# Patient Record
Sex: Female | Born: 1985 | Hispanic: Yes | Marital: Married | Smoking: Never smoker
Health system: Southern US, Community
[De-identification: ages and names within clinical notes are randomized; demographics above are authoritative.]

## PROBLEM LIST (undated history)

## (undated) ENCOUNTER — Inpatient Hospital Stay (HOSPITAL_COMMUNITY): Payer: Self-pay

## (undated) HISTORY — PX: BREAST SURGERY: SHX581

## (undated) HISTORY — PX: BREAST ENHANCEMENT SURGERY: SHX7

---

## 2016-10-25 ENCOUNTER — Encounter (HOSPITAL_BASED_OUTPATIENT_CLINIC_OR_DEPARTMENT_OTHER): Payer: Self-pay | Admitting: *Deleted

## 2016-10-25 ENCOUNTER — Emergency Department (HOSPITAL_BASED_OUTPATIENT_CLINIC_OR_DEPARTMENT_OTHER)
Admission: EM | Admit: 2016-10-25 | Discharge: 2016-10-25 | Disposition: A | Discharge status: Home / Self Care | Payer: Medicaid Other | Attending: Emergency Medicine | Admitting: Emergency Medicine

## 2016-10-25 ENCOUNTER — Emergency Department (HOSPITAL_BASED_OUTPATIENT_CLINIC_OR_DEPARTMENT_OTHER): Payer: Medicaid Other

## 2016-10-25 DIAGNOSIS — R1030 Lower abdominal pain, unspecified: Secondary | ICD-10-CM

## 2016-10-25 DIAGNOSIS — Z3490 Encounter for supervision of normal pregnancy, unspecified, unspecified trimester: Secondary | ICD-10-CM

## 2016-10-25 DIAGNOSIS — R102 Pelvic and perineal pain: Secondary | ICD-10-CM | POA: Insufficient documentation

## 2016-10-25 DIAGNOSIS — O26891 Other specified pregnancy related conditions, first trimester: Secondary | ICD-10-CM | POA: Insufficient documentation

## 2016-10-25 DIAGNOSIS — Z3A01 Less than 8 weeks gestation of pregnancy: Secondary | ICD-10-CM | POA: Diagnosis not present

## 2016-10-25 LAB — URINALYSIS, ROUTINE W REFLEX MICROSCOPIC
Bilirubin Urine: NEGATIVE
Glucose, UA: NEGATIVE mg/dL
KETONES UR: NEGATIVE mg/dL
LEUKOCYTES UA: NEGATIVE
NITRITE: NEGATIVE
PH: 6.5 (ref 5.0–8.0)
Protein, ur: NEGATIVE mg/dL
Specific Gravity, Urine: 1.013 (ref 1.005–1.030)

## 2016-10-25 LAB — URINE MICROSCOPIC-ADD ON

## 2016-10-25 LAB — HCG, QUANTITATIVE, PREGNANCY: HCG, BETA CHAIN, QUANT, S: 1898 m[IU]/mL — AB (ref <5)

## 2016-10-25 LAB — PREGNANCY, URINE: Preg Test, Ur: POSITIVE — AB

## 2016-10-25 NOTE — Discharge Instructions (Signed)
You were seen in the ED today with lower abdominal pain. We found evidence of an early pregnancy. We were unable to see a pregnancy in the uterus, probably because it is very early. You will be called by the OB/Gyn tomorrow to set up an appointment for repeat blood work to ensure that the pregnancy is in the correct place and healthy.   Return to the ED immediately with any new or suddenly worsening pain, nausea, or fainting.

## 2016-10-25 NOTE — ED Notes (Signed)
Patient transported to Ultrasound 

## 2016-10-25 NOTE — ED Notes (Signed)
ED Provider at bedside. 

## 2016-10-25 NOTE — ED Provider Notes (Signed)
Emergency Department Provider Note  By signing my name below, I, Teofilo Pod, attest that this documentation has been prepared under the direction and in the presence of Maia Plan, MD . Electronically Signed: Teofilo Pod, ED Scribe. 10/25/2016. 7:34 PM.   I have reviewed the triage vital signs and the nursing notes.   HISTORY  Chief Complaint Abdominal Pain  HPI Alice Sweeney is a 30 y.o. female who presents to the Emergency Department complaining of constant lower abdominal pain that began 2 days ago. Pt had a positive pregnancy test at [facility] today, and was referred to the ED by UC. LNMP was 09/19/16.  Pt reports similar pain previously that was associated with her periods. No alleviating factors noted. Pt denies vaginal pain, vaginal discharge, chest pain. No exacerbating factors.   History reviewed. No pertinent past medical history.  There are no active problems to display for this patient.   Past Surgical History:  Procedure Laterality Date  . BREAST SURGERY        Allergies Patient has no known allergies.  No family history on file.  Social History Social History  Substance Use Topics  . Smoking status: Never Smoker  . Smokeless tobacco: Never Used  . Alcohol use No    Review of Systems  Constitutional: No fever/chills Eyes: No visual changes. ENT: No sore throat. Cardiovascular: Denies chest pain. Respiratory: Denies shortness of breath. Gastrointestinal: Positive for abdominal pain.  No nausea, no vomiting.  No diarrhea.  No constipation. Genitourinary: Negative for dysuria. Negative for vaginal pain/discharge. Musculoskeletal: Negative for back pain. Skin: Negative for rash. Neurological: Negative for headaches, focal weakness or numbness.  10-point ROS otherwise negative.  ____________________________________________   PHYSICAL EXAM:  VITAL SIGNS: ED Triage Vitals  Enc Vitals Group     BP 10/25/16 1858 123/77     Pulse  Rate 10/25/16 1858 63     Resp 10/25/16 1858 18     Temp 10/25/16 1858 98.4 F (36.9 C)     Temp Source 10/25/16 1858 Oral     SpO2 10/25/16 1858 100 %     Weight 10/25/16 1858 135 lb (61.2 kg)     Height 10/25/16 1858 5' (1.524 m)     Pain Score 10/25/16 1901 6   Constitutional: Alert and oriented. Well appearing and in no acute distress. Eyes: Conjunctivae are normal.  Head: Atraumatic. Nose: No congestion/rhinnorhea. Mouth/Throat: Mucous membranes are moist.   Neck: No stridor.   Cardiovascular: Normal rate, regular rhythm. Good peripheral circulation. Grossly normal heart sounds.   Respiratory: Normal respiratory effort.  No retractions. Lungs CTAB. Gastrointestinal: Soft with minimal lower abdominal tenderness. No distention.  Musculoskeletal: No lower extremity tenderness nor edema. No gross deformities of extremities. Neurologic:  Normal speech and language. No gross focal neurologic deficits are appreciated.  Skin:  Skin is warm, dry and intact. No rash noted.  ____________________________________________   LABS (all labs ordered are listed, but only abnormal results are displayed)  Labs Reviewed  URINALYSIS, ROUTINE W REFLEX MICROSCOPIC (NOT AT [facility]) - Abnormal; Notable for the following:       Result Value   Hgb urine dipstick TRACE (*)    All other components within normal limits  PREGNANCY, URINE - Abnormal; Notable for the following:    Preg Test, Ur POSITIVE (*)    All other components within normal limits  HCG, QUANTITATIVE, PREGNANCY - Abnormal; Notable for the following:    hCG, Beta Chain, Quant, S 1,898 (*)  All other components within normal limits  URINE MICROSCOPIC-ADD ON - Abnormal; Notable for the following:    Squamous Epithelial / LPF 0-5 (*)    Bacteria, UA RARE (*)    All other components within normal limits   ____________________________________________  RADIOLOGY  [place] Ob Comp Less 14 Wks  Result Date: 10/25/2016 CLINICAL DATA:  30 y/o  F; lower back pain and frontal pelvic pain for 1 week. Positive beta HCG. EXAM: OBSTETRIC <14 WK ULTRASOUND TECHNIQUE: Transabdominal ultrasound was performed for evaluation of the gestation as well as the maternal uterus and adnexal regions. COMPARISON:  None. FINDINGS: Intrauterine gestational sac: Single Yolk sac:  Not Visualized. Embryo:  Not Visualized. Cardiac Activity: Not Visualized. MSD: 3 mm  mm   5 w   0  d Subchorionic hemorrhage:  None visualized. Maternal uterus/adnexae: Normal uterus and right ovary. Left ovary avascular round well-circumscribed structure measuring 2.2 x 1.9 x 2.3 cm with internal low-level echoes and linear foci, probably representing a hemorrhagic corpus luteum. IMPRESSION: Probable early intrauterine gestational sac, but no yolk sac, fetal pole, or cardiac activity yet visualized. Recommend follow-up quantitative B-HCG levels and follow-up US in 14 days to confirm and assess viability. This recommendation follows SRU consensus guidelines: Diagnostic Criteria for Nonviable Pregnancy Early in the First Trimester. Malva Limes Engl J Med 2013; 161:0960-45; 369:1443-51. Electronically Signed   By: Mitzi HansenLance  Furusawa-Stratton M.D.   On: 10/25/2016 21:49   [place] Ob Transvaginal  Result Date: 10/25/2016 CLINICAL DATA:  30 y/o F; lower back pain and frontal pelvic pain for 1 week. Positive beta HCG. EXAM: OBSTETRIC <14 WK ULTRASOUND TECHNIQUE: Transabdominal ultrasound was performed for evaluation of the gestation as well as the maternal uterus and adnexal regions. COMPARISON:  None. FINDINGS: Intrauterine gestational sac: Single Yolk sac:  Not Visualized. Embryo:  Not Visualized. Cardiac Activity: Not Visualized. MSD: 3 mm  mm   5 w   0  d Subchorionic hemorrhage:  None visualized. Maternal uterus/adnexae: Normal uterus and right ovary. Left ovary avascular round well-circumscribed structure measuring 2.2 x 1.9 x 2.3 cm with internal low-level echoes and linear foci, probably representing a hemorrhagic corpus  luteum. IMPRESSION: Probable early intrauterine gestational sac, but no yolk sac, fetal pole, or cardiac activity yet visualized. Recommend follow-up quantitative B-HCG levels and follow-up US in 14 days to confirm and assess viability. This recommendation follows SRU consensus guidelines: Diagnostic Criteria for Nonviable Pregnancy Early in the First Trimester. Malva Limes Engl J Med 2013; 409:8119-14; 369:1443-51. Electronically Signed   By: Mitzi HansenLance  Furusawa-Stratton M.D.   On: 10/25/2016 21:49    ____________________________________________   PROCEDURES  Procedure(s) performed:   Procedures  None ____________________________________________   INITIAL IMPRESSION / ASSESSMENT AND PLAN / ED COURSE  Pertinent labs & imaging results that were available during my care of the patient were reviewed by me and considered in my medical decision making (see chart for details).  Patient resents the emergency department for evaluation of lower abdominal discomfort and positive urine pregnancy test at outside urgent care. She has minimal to no lower abdominal tenderness on my exam. No distention. I performed a bedside ultrasound which did not confirm intrauterine pregnancy. Unknown gestational age. Plan for quant hCG and reassess with possible TVUS if above the discriminatory zone.   10:03 PM Patient with possible early gestational sac but no yolk sac. Cannot confirm intrauterine pregnancy after transvaginal ultrasound. I paged GYN to discuss follow-up hCG with overall low suspicion for ectopic pregnancy. Patient is resting and comfortable at this time.  10:16 PM Spoke with Dr. Adrian BlackwaterStinson who will arrange outpatient follow up and quant. Appreciate assistance with follow up. Discussed plan with the patient in detail including return precautions.   ____________________________________________  FINAL CLINICAL IMPRESSION(S) / ED DIAGNOSES  Final diagnoses:  Lower abdominal pain  Early stage of pregnancy     MEDICATIONS  GIVEN DURING THIS VISIT:  None  NEW OUTPATIENT MEDICATIONS STARTED DURING THIS VISIT:  None   Note:  This document was prepared using Dragon voice recognition software and may include unintentional dictation errors.  Alona BeneJoshua Long, MD Emergency Medicine  I personally performed the services described in this documentation, which was scribed in my presence. The recorded information has been reviewed and is accurate.       Maia PlanJoshua G Long, MD 10/26/16 [phone]

## 2016-10-25 NOTE — ED Triage Notes (Signed)
Abdominal pain. [facility] after having a positive pregnancy test. She does not know how many weeks she is.

## 2016-10-27 ENCOUNTER — Other Ambulatory Visit: Payer: Self-pay

## 2016-12-20 NOTE — L&D Delivery Note (Signed)
31 y.o. G1P0 at 7270w5d delivered a viable female infant in cephalic, LOA position. Anterior shoulder delivered with ease. 60 sec delayed cord clamping. Cord clamped x2 and cut. Placenta delivered spontaneously intact, with 3VC. Fundus firm on exam with massage and pitocin. Good hemostasis noted.  Anesthesia: None Laceration: bilateral labial lacerations Suture: 4.0 monocryl  Good hemostasis noted. EBL: 150 cc  Mom and baby recovering in LDR.    Apgars: APGAR (1 MIN): 8   APGAR (5 MINS): 9    Weight: Pending skin to skin  Sponge and instrument count were correct x2. Placenta sent to L&D  Howard PouchLauren Feng, MD PGY-2 Family Medicine 06/24/2017, 3:14 AM   OB FELLOW DELIVERY ATTESTATION  I was gloved and present for the delivery in its entirety, and I agree with the above resident's note.    Jen MowElizabeth Mumaw, DO OB Fellow

## 2016-12-23 LAB — OB RESULTS CONSOLE HEPATITIS B SURFACE ANTIGEN: HEP B S AG: NEGATIVE

## 2016-12-23 LAB — OB RESULTS CONSOLE RUBELLA ANTIBODY, IGM: RUBELLA: IMMUNE

## 2017-01-17 ENCOUNTER — Emergency Department (HOSPITAL_COMMUNITY)
Admission: EM | Admit: 2017-01-17 | Discharge: 2017-01-18 | Discharge status: Against Medical Advice | Payer: Medicaid Other | Source: Home / Self Care | Attending: Emergency Medicine | Admitting: Emergency Medicine

## 2017-01-17 ENCOUNTER — Encounter (HOSPITAL_COMMUNITY): Payer: Self-pay | Admitting: Emergency Medicine

## 2017-01-17 DIAGNOSIS — Z3A17 17 weeks gestation of pregnancy: Secondary | ICD-10-CM

## 2017-01-17 DIAGNOSIS — S060X0A Concussion without loss of consciousness, initial encounter: Secondary | ICD-10-CM

## 2017-01-17 DIAGNOSIS — O9A212 Injury, poisoning and certain other consequences of external causes complicating pregnancy, second trimester: Secondary | ICD-10-CM

## 2017-01-17 DIAGNOSIS — S39012A Strain of muscle, fascia and tendon of lower back, initial encounter: Secondary | ICD-10-CM | POA: Diagnosis not present

## 2017-01-17 DIAGNOSIS — S0091XA Abrasion of unspecified part of head, initial encounter: Secondary | ICD-10-CM | POA: Diagnosis not present

## 2017-01-17 DIAGNOSIS — Y939 Activity, unspecified: Secondary | ICD-10-CM

## 2017-01-17 DIAGNOSIS — Y999 Unspecified external cause status: Secondary | ICD-10-CM

## 2017-01-17 DIAGNOSIS — M546 Pain in thoracic spine: Secondary | ICD-10-CM | POA: Diagnosis not present

## 2017-01-17 DIAGNOSIS — S20211A Contusion of right front wall of thorax, initial encounter: Secondary | ICD-10-CM | POA: Insufficient documentation

## 2017-01-17 DIAGNOSIS — M542 Cervicalgia: Secondary | ICD-10-CM | POA: Diagnosis not present

## 2017-01-17 DIAGNOSIS — Y92410 Unspecified street and highway as the place of occurrence of the external cause: Secondary | ICD-10-CM | POA: Insufficient documentation

## 2017-01-17 DIAGNOSIS — S161XXA Strain of muscle, fascia and tendon at neck level, initial encounter: Secondary | ICD-10-CM | POA: Diagnosis not present
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
## 2017-01-18 ENCOUNTER — Inpatient Hospital Stay (HOSPITAL_COMMUNITY)
Admission: AD | Admit: 2017-01-18 | Discharge: 2017-01-18 | Disposition: A | Discharge status: Home / Self Care | Payer: Medicaid Other | Source: Ambulatory Visit | Attending: Emergency Medicine | Admitting: Emergency Medicine

## 2017-01-18 ENCOUNTER — Encounter (HOSPITAL_COMMUNITY): Payer: Self-pay | Admitting: *Deleted

## 2017-01-18 DIAGNOSIS — S0091XA Abrasion of unspecified part of head, initial encounter: Secondary | ICD-10-CM | POA: Diagnosis not present

## 2017-01-18 DIAGNOSIS — S060X0A Concussion without loss of consciousness, initial encounter: Secondary | ICD-10-CM

## 2017-01-18 DIAGNOSIS — M546 Pain in thoracic spine: Secondary | ICD-10-CM

## 2017-01-18 DIAGNOSIS — M542 Cervicalgia: Secondary | ICD-10-CM

## 2017-01-18 DIAGNOSIS — R1012 Left upper quadrant pain: Secondary | ICD-10-CM

## 2017-01-18 DIAGNOSIS — Y939 Activity, unspecified: Secondary | ICD-10-CM | POA: Insufficient documentation

## 2017-01-18 DIAGNOSIS — S39012A Strain of muscle, fascia and tendon of lower back, initial encounter: Secondary | ICD-10-CM

## 2017-01-18 DIAGNOSIS — Y999 Unspecified external cause status: Secondary | ICD-10-CM | POA: Insufficient documentation

## 2017-01-18 DIAGNOSIS — G44309 Post-traumatic headache, unspecified, not intractable: Secondary | ICD-10-CM

## 2017-01-18 DIAGNOSIS — S161XXA Strain of muscle, fascia and tendon at neck level, initial encounter: Secondary | ICD-10-CM

## 2017-01-18 DIAGNOSIS — Z3A17 17 weeks gestation of pregnancy: Secondary | ICD-10-CM

## 2017-01-18 DIAGNOSIS — Y92410 Unspecified street and highway as the place of occurrence of the external cause: Secondary | ICD-10-CM | POA: Insufficient documentation

## 2017-01-18 DIAGNOSIS — O9A212 Injury, poisoning and certain other consequences of external causes complicating pregnancy, second trimester: Secondary | ICD-10-CM | POA: Insufficient documentation

## 2017-01-18 MED ORDER — CYCLOBENZAPRINE HCL 10 MG PO TABS: 10.0000 mg | ORAL_TABLET | Freq: Every evening | ORAL | 0 refills | Status: AC | PRN
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
## 2017-01-18 NOTE — ED Notes (Signed)
Pt here from MAU.  Pt to be seen. Phone contact made with Dr. Adriana Simasook regarding private vehicle transport.
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
## 2017-01-18 NOTE — ED Notes (Signed)
Patient was alert, oriented and stable upon discharge. RN went over AVS and patient had no further questions. Patient was instructed not to drive or operate heavy machinery on muscle relaxers.

## 2017-01-18 NOTE — Discharge Instructions (Signed)
Take Tylenol (Acetaminophen) for pain for the next week.  Take muscle relaxer at bedtime to help you sleep. This medicine makes you drowsy so do not take before driving or work Use a heating pad for sore muscles - use for 20 minutes several times a day

## 2017-01-18 NOTE — MAU Note (Signed)
Urine in lab 

## 2017-01-21 ENCOUNTER — Inpatient Hospital Stay (HOSPITAL_COMMUNITY)
Admission: AD | Admit: 2017-01-21 | Discharge: 2017-01-21 | Disposition: A | Discharge status: Home / Self Care | Payer: Medicaid Other | Source: Ambulatory Visit | Attending: Obstetrics and Gynecology | Admitting: Obstetrics and Gynecology

## 2017-01-21 DIAGNOSIS — Z3A17 17 weeks gestation of pregnancy: Secondary | ICD-10-CM | POA: Diagnosis not present

## 2017-01-21 DIAGNOSIS — O26892 Other specified pregnancy related conditions, second trimester: Secondary | ICD-10-CM

## 2017-01-21 DIAGNOSIS — O360920 Maternal care for other rhesus isoimmunization, second trimester, not applicable or unspecified: Secondary | ICD-10-CM | POA: Insufficient documentation

## 2017-01-21 DIAGNOSIS — Z6791 Unspecified blood type, Rh negative: Secondary | ICD-10-CM

## 2017-01-21 LAB — KLEIHAUER-BETKE STAIN
# Vials RhIg: 1
FETAL CELLS %: 0 %
QUANTITATION FETAL HEMOGLOBIN: 0 mL

## 2017-01-21 LAB — OB RESULTS CONSOLE HIV ANTIBODY (ROUTINE TESTING): HIV: NONREACTIVE

## 2017-01-21 LAB — ABO/RH: ABO/RH(D): O NEG

## 2017-01-21 MED ORDER — RHO D IMMUNE GLOBULIN 1500 UNIT/2ML IJ SOSY
300.0000 ug | PREFILLED_SYRINGE | Freq: Once | INTRAMUSCULAR | Status: AC
  Administered 2017-01-21: 300 ug via INTRAMUSCULAR
  Filled 2017-01-21: qty 2

## 2017-01-21 NOTE — MAU Note (Signed)
Per registration patient is leaving without receiving Rhogam, notified Ninfa MeekerJudy Lowe RN Charge.

## 2017-01-21 NOTE — MAU Note (Signed)
Pt informed staff she was leaving, pt was told we have the rhophylac & are ready to administer it, still decided to leave.  Rhophylac returned to blood bank.

## 2017-01-21 NOTE — MAU Note (Signed)
Pt returned, rhophylac injection given.

## 2017-01-22 LAB — RH IG WORKUP (INCLUDES ABO/RH)
ABO/RH(D): O NEG
Antibody Screen: NEGATIVE
Gestational Age(Wks): 17
UNIT DIVISION: 0

## 2017-04-06 LAB — OB RESULTS CONSOLE RPR: RPR: NONREACTIVE

## 2017-06-03 LAB — OB RESULTS CONSOLE GC/CHLAMYDIA
Chlamydia: NEGATIVE
GC PROBE AMP, GENITAL: NEGATIVE

## 2017-06-03 LAB — OB RESULTS CONSOLE GBS: GBS: POSITIVE

## 2017-06-18 ENCOUNTER — Encounter (HOSPITAL_COMMUNITY): Payer: Self-pay | Admitting: *Deleted

## 2017-06-18 ENCOUNTER — Inpatient Hospital Stay (HOSPITAL_COMMUNITY)
Admission: AD | Admit: 2017-06-18 | Discharge: 2017-06-18 | Disposition: A | Discharge status: Home / Self Care | Payer: Medicaid Other | Source: Ambulatory Visit | Attending: Family Medicine | Admitting: Family Medicine

## 2017-06-18 DIAGNOSIS — Z3A38 38 weeks gestation of pregnancy: Secondary | ICD-10-CM | POA: Insufficient documentation

## 2017-06-18 DIAGNOSIS — R109 Unspecified abdominal pain: Secondary | ICD-10-CM | POA: Diagnosis not present

## 2017-06-18 DIAGNOSIS — O26893 Other specified pregnancy related conditions, third trimester: Secondary | ICD-10-CM | POA: Diagnosis not present

## 2017-06-18 LAB — URINALYSIS, ROUTINE W REFLEX MICROSCOPIC
BILIRUBIN URINE: NEGATIVE
Glucose, UA: NEGATIVE mg/dL
Hgb urine dipstick: NEGATIVE
KETONES UR: NEGATIVE mg/dL
Nitrite: NEGATIVE
PROTEIN: NEGATIVE mg/dL
Specific Gravity, Urine: 1.016 (ref 1.005–1.030)
pH: 6 (ref 5.0–8.0)

## 2017-06-18 NOTE — MAU Provider Note (Signed)
History   G1 @ 38.6 wks in with left side abd pain since yesterday. Denies ROM or vag bleeding. Pain is intermittant and worse with fetal movement. Pt also c/o becoming weak yesterday in the heat.  CSN: [id number]  Arrival date & time 06/18/17  1746   None     Chief Complaint  Patient presents with  . Abdominal Pain    HPI  History reviewed. No pertinent past medical history.  Past Surgical History:  Procedure Laterality Date  . BREAST SURGERY      History reviewed. No pertinent family history.  Social History  Substance Use Topics  . Smoking status: Never Smoker  . Smokeless tobacco: Never Used  . Alcohol use No    OB History    Gravida Para Term Preterm AB Living   1             SAB TAB Ectopic Multiple Live Births                  Review of Systems  Constitutional: Negative.   HENT: Negative.   Eyes: Negative.   Respiratory: Negative.   Cardiovascular: Negative.   Gastrointestinal: Positive for abdominal pain.  Endocrine: Negative.   Genitourinary: Negative.   Musculoskeletal: Negative.   Skin: Negative.   Allergic/Immunologic: Negative.   Neurological: Negative.   Hematological: Negative.   Psychiatric/Behavioral: Negative.     Allergies  Patient has no known allergies.  Home Medications    BP 119/75   Pulse 96   Ht 5' (1.524 m)   Wt 162 lb (73.5 kg)   LMP 09/19/2016   BMI 31.64 kg/m   Physical Exam  Constitutional: She is oriented to person, place, and time. She appears well-developed and well-nourished.  HENT:  Head: Normocephalic.  Eyes: Pupils are equal, round, and reactive to light.  Neck: Normal range of motion.  Cardiovascular: Normal rate, regular rhythm, normal heart sounds and intact distal pulses.   Pulmonary/Chest: Effort normal and breath sounds normal.  Abdominal: Soft. Bowel sounds are normal.  Genitourinary: Vagina normal and uterus normal.  Musculoskeletal: Normal range of motion.  Neurological: She is alert and  oriented to person, place, and time. She has normal reflexes.  Skin: Skin is warm and dry.  Psychiatric: She has a normal mood and affect. Her behavior is normal. Judgment and thought content normal.    MAU Course  Procedures (including critical care time)  Labs Reviewed  URINALYSIS, ROUTINE W REFLEX MICROSCOPIC   No results found.   Dx. abd pain in preg   MDM  VSS, Orthostatics done and all WNL. FHR pattern reactive NST . SVE 1/70/-2 post . Normal discomforts of preg. Will d/c home

## 2017-06-18 NOTE — Discharge Instructions (Signed)
Dolor abdominal durante el embarazo (Abdominal Pain During Pregnancy) El dolor de vientre (abdominal) es habitual durante el embarazo. Generalmente no se trata de un problema grave. Otras veces puede ser un signo de que algo no anda bien. Siempre comunquese con su mdico si tiene dolor abdominal. CUIDADOS EN EL HOGAR Controle el dolor para ver si hay cambios. Las indicaciones que siguen pueden ayudarla a sentirse mejor:  Notenga sexo (relaciones sexuales) ni se coloque nada dentro de la vagina hasta que se sienta mejor.  Haga reposo hasta que el dolor se calme.  Si siente ganas de vomitar (nuseas ) beba lquidos claros. No consuma alimentos slidos hasta que se sienta mejor.  Slo tome los medicamentos que le haya indicado su mdico.  Cumpla con las visitas al mdico segn las indicaciones. SOLICITE AYUDA DE INMEDIATO SI:  Tiene un sangrado, pierde lquido o elimina trozos de tejido por la vagina.  Siente ms dolor o clicos.  Comienza a vomitar.  Siente dolor al orinar u observa sangre en la orina.  Tiene fiebre.  No siente que el beb se mueva mucho.  Se siente muy dbil o cree que va a desmayarse.  Tiene dificultad para respirar con o sin dolor en el vientre.  Siente un dolor de cabeza muy intenso y dolor en el vientre.  Observa que sale un lquido por la vagina y tiene dolor abdominal.  La materia fecal es lquida (diarrea).  El dolor en el viente no desaparece, o empeora, luego de hacer reposo. ASEGRESE DE QUE:  Comprende estas instrucciones.  Controlar su afeccin.  Recibir ayuda de inmediato si no mejora o si empeora. Esta informacin no tiene como fin reemplazar el consejo del mdico. Asegrese de hacerle al mdico cualquier pregunta que tenga. Document Released: 08/18/2011 Document Revised: 03/29/2016 Document Reviewed: 07/05/2013 Elsevier Interactive Patient Education  2018 Elsevier Inc.  

## 2017-06-21 LAB — URINE CULTURE: Culture: 1000 — AB

## 2017-06-23 ENCOUNTER — Inpatient Hospital Stay (HOSPITAL_COMMUNITY)
Admission: AD | Admit: 2017-06-23 | Discharge: 2017-06-26 | DRG: 775 | Disposition: A | Discharge status: Home / Self Care | Payer: Medicaid Other | Source: Ambulatory Visit | Attending: Obstetrics and Gynecology | Admitting: Obstetrics and Gynecology

## 2017-06-23 ENCOUNTER — Encounter (HOSPITAL_COMMUNITY): Payer: Self-pay | Admitting: *Deleted

## 2017-06-23 DIAGNOSIS — Z3A39 39 weeks gestation of pregnancy: Secondary | ICD-10-CM

## 2017-06-23 DIAGNOSIS — O99824 Streptococcus B carrier state complicating childbirth: Principal | ICD-10-CM | POA: Diagnosis present

## 2017-06-23 DIAGNOSIS — Z3493 Encounter for supervision of normal pregnancy, unspecified, third trimester: Secondary | ICD-10-CM | POA: Diagnosis present

## 2017-06-23 LAB — CBC
HCT: 34.5 % — ABNORMAL LOW (ref 36.0–46.0)
HEMOGLOBIN: 11.5 g/dL — AB (ref 12.0–15.0)
MCH: 29.8 pg (ref 26.0–34.0)
MCHC: 33.3 g/dL (ref 30.0–36.0)
MCV: 89.4 fL (ref 78.0–100.0)
PLATELETS: 315 10*3/uL (ref 150–400)
RBC: 3.86 MIL/uL — AB (ref 3.87–5.11)
RDW: 14.3 % (ref 11.5–15.5)
WBC: 23.1 10*3/uL — AB (ref 4.0–10.5)

## 2017-06-23 LAB — TYPE AND SCREEN
ABO/RH(D): O NEG
ANTIBODY SCREEN: NEGATIVE

## 2017-06-23 MED ORDER — ACETAMINOPHEN 325 MG PO TABS
650.0000 mg | ORAL_TABLET | ORAL | Status: DC | PRN
  Filled 2017-06-23 (×2): qty 2

## 2017-06-23 MED ORDER — OXYTOCIN BOLUS FROM INFUSION
500.0000 mL | Freq: Once | INTRAVENOUS | Status: AC
  Administered 2017-06-24: 500 mL via INTRAVENOUS

## 2017-06-23 MED ORDER — PENICILLIN G POT IN DEXTROSE 60000 UNIT/ML IV SOLN
3.0000 10*6.[IU] | INTRAVENOUS | Status: DC
  Administered 2017-06-23 (×3): 3 10*6.[IU] via INTRAVENOUS
  Filled 2017-06-23 (×7): qty 50

## 2017-06-23 MED ORDER — OXYCODONE-ACETAMINOPHEN 5-325 MG PO TABS
1.0000 | ORAL_TABLET | ORAL | Status: DC | PRN
  Administered 2017-06-25: 1 via ORAL
  Filled 2017-06-23: qty 1

## 2017-06-23 MED ORDER — SOD CITRATE-CITRIC ACID 500-334 MG/5ML PO SOLN: 30.0000 mL | ORAL | Status: DC | PRN

## 2017-06-23 MED ORDER — ONDANSETRON HCL 4 MG/2ML IJ SOLN: 4.0000 mg | Freq: Four times a day (QID) | INTRAMUSCULAR | Status: DC | PRN

## 2017-06-23 MED ORDER — FENTANYL CITRATE (PF) 100 MCG/2ML IJ SOLN
100.0000 ug | INTRAMUSCULAR | Status: DC | PRN
  Administered 2017-06-23 (×3): 100 ug via INTRAVENOUS
  Filled 2017-06-23 (×3): qty 2

## 2017-06-23 MED ORDER — LIDOCAINE HCL (PF) 1 % IJ SOLN
30.0000 mL | INTRAMUSCULAR | Status: AC | PRN
  Administered 2017-06-24: 30 mL via SUBCUTANEOUS
  Filled 2017-06-23: qty 30

## 2017-06-23 MED ORDER — LACTATED RINGERS IV SOLN
INTRAVENOUS | Status: DC
  Administered 2017-06-23: 10:00:00 via INTRAVENOUS
  Administered 2017-06-23: 1000 mL via INTRAVENOUS
  Administered 2017-06-23: via INTRAVENOUS

## 2017-06-23 MED ORDER — OXYTOCIN 40 UNITS IN LACTATED RINGERS INFUSION - SIMPLE MED
2.5000 [IU]/h | INTRAVENOUS | Status: DC
  Filled 2017-06-23: qty 1000

## 2017-06-23 MED ORDER — OXYCODONE-ACETAMINOPHEN 5-325 MG PO TABS: 2.0000 | ORAL_TABLET | ORAL | Status: DC | PRN

## 2017-06-23 MED ORDER — LACTATED RINGERS IV SOLN: 500.0000 mL | INTRAVENOUS | Status: DC | PRN

## 2017-06-23 MED ORDER — DEXTROSE 5 % IV SOLN
5.0000 10*6.[IU] | Freq: Once | INTRAVENOUS | Status: AC
  Administered 2017-06-23: 5 10*6.[IU] via INTRAVENOUS
  Filled 2017-06-23: qty 5

## 2017-06-23 MED ORDER — FLEET ENEMA 7-19 GM/118ML RE ENEM: 1.0000 | ENEMA | RECTAL | Status: DC | PRN

## 2017-06-23 MED ORDER — TERBUTALINE SULFATE 1 MG/ML IJ SOLN
0.2500 mg | Freq: Once | INTRAMUSCULAR | Status: DC | PRN
  Filled 2017-06-23: qty 1

## 2017-06-23 MED ORDER — OXYTOCIN 40 UNITS IN LACTATED RINGERS INFUSION - SIMPLE MED
1.0000 m[IU]/min | INTRAVENOUS | Status: DC
  Administered 2017-06-23: 2 m[IU]/min via INTRAVENOUS

## 2017-06-23 NOTE — H&P (Signed)
LABOR ADMISSION HISTORY AND PHYSICAL  Alice Sweeney is a 31 y.o. female G1P0 with IUP at [redacted]w[redacted]d by LMP presenting for labor. She declines an interpreter, FOB is serving as interpreter. She reports +FMs, bloody show around 5am this morning.  no VB, no blurry vision, headaches or peripheral edema, or RUQ pain.  She plans on breast feeding. She request mini-pill for birth control.  Dating: By LMP --->  Estimated Date of Delivery: 06/26/17    Prenatal History/Complications:  Past Medical History: History reviewed. No pertinent past medical history.  Past Surgical History: Past Surgical History:  Procedure Laterality Date  . BREAST SURGERY      Obstetrical History: OB History    Gravida Para Term Preterm AB Living   1             SAB TAB Ectopic Multiple Live Births                  Social History: Social History   Social History  . Marital status: Married    Spouse name: N/A  . Number of children: N/A  . Years of education: N/A   Social History Main Topics  . Smoking status: Never Smoker  . Smokeless tobacco: Never Used  . Alcohol use No  . Drug use: No  . Sexual activity: Yes   Other Topics Concern  . None   Social History Narrative  . None    Family History: History reviewed. No pertinent family history.  Allergies: No Known Allergies  Prescriptions Prior to Admission  Medication Sig Dispense Refill Last Dose  . calcium carbonate (TUMS - DOSED IN MG ELEMENTAL CALCIUM) 500 MG chewable tablet Chew 1 tablet by mouth 2 (two) times daily as needed for indigestion or heartburn.   Past Week at Unknown time  . Prenatal Vit-Fe Fumarate-FA (PRENATAL MULTIVITAMIN) TABS tablet Take 1 tablet by mouth daily at 12 noon.   06/22/2017 at Unknown time  . cyclobenzaprine (FLEXERIL) 10 MG tablet Take 1 tablet (10 mg total) by mouth at bedtime and may repeat dose one time if needed. (Patient not taking: Reported on 06/23/2017) 10 tablet 0 Not Taking at Unknown time     Review of  Systems   All systems reviewed and negative except as stated in HPI  Blood pressure 126/75, pulse 84, temperature 97.8 F (36.6 C), temperature source Oral, height 5' (1.524 m), weight 73.9 kg (163 lb), last menstrual period 09/19/2016, SpO2 98 %. General appearance: alert, cooperative and no distress Presentation: cephalic Fetal monitoringBaseline: 145 bpm, Variability: Good {> 6 bpm), Accelerations: Reactive and Decelerations: Absent Uterine activityFrequency: Every 3-5 minutes Dilation: 5 Effacement (%): 100 Station: -1 Exam by:: Duncan Dull   Prenatal labs: ABO, Rh: --/--/O NEG, O NEG (02/02 1439) Antibody: NEG (02/02 1439) Rubella: Immune RPR: Nonreactive (04/18 0000)  HBsAg: Negative (01/04 0000)  HIV: Non-reactive (02/02 0000)  GBS: Positive (06/15 0000)  1 hr Glucola: 103, Normal Genetic screening: Negative    Prenatal Transfer Tool  Maternal Diabetes: No Genetic Screening: Normal Maternal Ultrasounds/Referrals: Normal Fetal Ultrasounds or other Referrals:  None Maternal Substance Abuse:  No Significant Maternal Medications:  None Significant Maternal Lab Results: Lab values include: Group B Strep positive  No results found for this or any previous visit (from the past 24 hour(s)).  There are no active problems to display for this patient.   Assessment: Alice Sweeney is a 31 y.o. G1P0 at [redacted]w[redacted]d here for labor  #Labor: 4 cm upon arrival, continue to monitor  progression of labor. Consider augmentation if necessary. #Pain: No epidural  #FWB: Cat 1 #ID: GBS positive #MOF: Breast feeding #MOC: Progestin mini-pill  Jeanie CooksSarah Chen, Medical Student 06/23/2017, 10:02 AM   OB FELLOW HISTORY AND PHYSICAL ATTESTATION  I have seen and examined this patient; I agree with above documentation in the student's note. I saw the patient independently from the student and performed my own physical exam findings and agree with the above note.    Jen MowElizabeth Beronica Lansdale, DO OB  Fellow

## 2017-06-23 NOTE — Progress Notes (Signed)
Labor Progress Note  Alice Sweeney is a 31 y.o. G1P0 at 5147w4d  admitted for spontaneous labor.  O:  BP 126/69   Pulse 84   Temp 99.8 F (37.7 C) (Oral)   Resp 18   Ht 5' (1.524 m)   Wt 73.9 kg (163 lb)   LMP 09/19/2016   SpO2 98%   BMI 31.83 kg/m   FHT:  120 bmp, marked variability, accels no decels UC:   q2-4369m SVE:   Dilation: Lip/rim Effacement (%): 90 Station: -1 Exam by:: B. parks, RN  AROM @1900  clear  Pitocin @ 2 mu/min  Labs: Lab Results  Component Value Date   WBC 23.1 (H) 06/23/2017   HGB 11.5 (L) 06/23/2017   HCT 34.5 (L) 06/23/2017   MCV 89.4 06/23/2017   PLT 315 06/23/2017    Assessment / Plan: Labor: 9.5 cm dilated x2 hours, started pitocin, hands and knees position for anterior lip Fetal Wellbeing:  Category I Pain Control:  Labor support without medications Anticipated MOD:  NSVD  Expectant management  Howard PouchLauren Michaelina Blandino, MD PGY-2 [facility]

## 2017-06-23 NOTE — Progress Notes (Signed)
Off for pt to shower

## 2017-06-23 NOTE — Progress Notes (Signed)
Patient ID: Woodward KuMaria Fehnel, female   DOB: 09/19/1986, 31 y.o.   MRN: [id number] Labor Progress Note  S: Patient seen & examined for progress of labor. Patient does not have epidural. AROM at 1900 with moderate amount of clear fluid.    O: BP (!) 124/97    Pulse 90    Temp 98.6 F (37 C) (Oral)    Resp (!) 22    Ht 5' (1.524 m)    Wt 73.9 kg (163 lb)    LMP 09/19/2016    SpO2 98%    BMI 31.83 kg/m   FHT: 150bpm, mod var, +accels, no decels TOCO: q3-915min, patient looks comfortable during contractions  CVE: 9/100/-1 by Dr. Talbert ForestShirley    A&P: 31 y.o. G1P0 8231w4d   Continue expectant management Anticipate SVD  [place] Shirley, DO FM Resident PGY-1 06/23/2017 7:17 PM

## 2017-06-23 NOTE — MAU Note (Signed)
Per husband pt has been contracting since 3 am, some pinkish discharge. Reports she is still able to talk through contractions.

## 2017-06-23 NOTE — Progress Notes (Signed)
Labor Progress Note  S: Patient becoming increasingly uncomfortable. Cramping pain in lower abdomen. Does not want epidural.   O:  BP (!) 124/97   Pulse 90   Temp 99.3 F (37.4 C) (Oral)   Resp 18   Ht 5' (1.524 m)   Wt 73.9 kg (163 lb)   LMP 09/19/2016   SpO2 98%   BMI 31.83 kg/m  Cat 1 FHT baseline 150, moderate variability, accelerations present, no decelerations.  CVE: Dilation: 8.5 Effacement (%): 100 Cervical Position: Middle, Anterior Station: +1 Presentation: Vertex Exam by:: Talbert ForestShirley   A&P: 31 y.o. G1P0 2559w4d  2nd dose of antibiotics given at 14:40 Membranes ruptured at 19:04 Continue expectant management Anticipate SVD  Jeanie CooksSarah Musa Rewerts, Medical Student 7:22 PM

## 2017-06-23 NOTE — Progress Notes (Signed)
Labor Progress Note  S: Patient comfortable appearing, having trouble sleeping because of contractions. Still okay with no epidural. No changes since last seen.  O:  BP 137/87   Pulse (!) 101   Temp 98.6 F (37 C) (Oral)   Resp (!) 22   Ht 5' (1.524 m)   Wt 73.9 kg (163 lb)   LMP 09/19/2016   SpO2 98%   BMI 31.83 kg/m   FHT 140, moderate variability, accelerations present, no decels.  Contractions every 4-6 minutes.  CVE: Dilation: 5.5 Effacement (%): 100 Station: -1 Presentation: Vertex Exam by:: Foye ClockS. Oklesh RN    A&P: 31 y.o. G1P0 1664w4d  Continue expectant management, no epidural Due for 2nd dose of PCN in 30 minutes Anticipate artificial membrane rupture after 2nd dose of PCN  Jeanie CooksSarah Chen, Medical Student 2:00 PM

## 2017-06-24 ENCOUNTER — Encounter (HOSPITAL_COMMUNITY): Payer: Self-pay

## 2017-06-24 DIAGNOSIS — O99824 Streptococcus B carrier state complicating childbirth: Secondary | ICD-10-CM

## 2017-06-24 DIAGNOSIS — Z3A39 39 weeks gestation of pregnancy: Secondary | ICD-10-CM

## 2017-06-24 LAB — RPR: RPR Ser Ql: NONREACTIVE

## 2017-06-24 MED ORDER — PRENATAL MULTIVITAMIN CH
1.0000 | ORAL_TABLET | Freq: Every day | ORAL | Status: DC
  Administered 2017-06-24 – 2017-06-26 (×3): 1 via ORAL
  Filled 2017-06-24 (×3): qty 1

## 2017-06-24 MED ORDER — TETANUS-DIPHTH-ACELL PERTUSSIS 5-2.5-18.5 LF-MCG/0.5 IM SUSP: 0.5000 mL | Freq: Once | INTRAMUSCULAR | Status: DC

## 2017-06-24 MED ORDER — DIBUCAINE 1 % RE OINT: 1.0000 "application " | TOPICAL_OINTMENT | RECTAL | Status: DC | PRN

## 2017-06-24 MED ORDER — SENNOSIDES-DOCUSATE SODIUM 8.6-50 MG PO TABS
2.0000 | ORAL_TABLET | ORAL | Status: DC
  Administered 2017-06-25 (×2): 2 via ORAL
  Filled 2017-06-24 (×2): qty 2

## 2017-06-24 MED ORDER — ACETAMINOPHEN 325 MG PO TABS
650.0000 mg | ORAL_TABLET | ORAL | Status: DC | PRN
  Administered 2017-06-24 (×2): 650 mg via ORAL

## 2017-06-24 MED ORDER — WITCH HAZEL-GLYCERIN EX PADS: 1.0000 "application " | MEDICATED_PAD | CUTANEOUS | Status: DC | PRN

## 2017-06-24 MED ORDER — ONDANSETRON HCL 4 MG PO TABS: 4.0000 mg | ORAL_TABLET | ORAL | Status: DC | PRN

## 2017-06-24 MED ORDER — COCONUT OIL OIL
1.0000 "application " | TOPICAL_OIL | Status: DC | PRN
  Administered 2017-06-25: 1 via TOPICAL
  Filled 2017-06-24: qty 120

## 2017-06-24 MED ORDER — METHYLERGONOVINE MALEATE 0.2 MG PO TABS: 0.2000 mg | ORAL_TABLET | ORAL | Status: DC | PRN

## 2017-06-24 MED ORDER — IBUPROFEN 600 MG PO TABS
600.0000 mg | ORAL_TABLET | Freq: Four times a day (QID) | ORAL | Status: DC
  Administered 2017-06-24 – 2017-06-26 (×9): 600 mg via ORAL
  Filled 2017-06-24 (×9): qty 1

## 2017-06-24 MED ORDER — BENZOCAINE-MENTHOL 20-0.5 % EX AERO
1.0000 "application " | INHALATION_SPRAY | CUTANEOUS | Status: DC | PRN
  Administered 2017-06-24: 1 via TOPICAL
  Filled 2017-06-24: qty 56

## 2017-06-24 MED ORDER — ONDANSETRON HCL 4 MG/2ML IJ SOLN: 4.0000 mg | INTRAMUSCULAR | Status: DC | PRN

## 2017-06-24 MED ORDER — ZOLPIDEM TARTRATE 5 MG PO TABS: 5.0000 mg | ORAL_TABLET | Freq: Every evening | ORAL | Status: DC | PRN

## 2017-06-24 MED ORDER — SIMETHICONE 80 MG PO CHEW: 80.0000 mg | CHEWABLE_TABLET | ORAL | Status: DC | PRN

## 2017-06-24 MED ORDER — METHYLERGONOVINE MALEATE 0.2 MG/ML IJ SOLN: 0.2000 mg | INTRAMUSCULAR | Status: DC | PRN

## 2017-06-24 MED ORDER — DIPHENHYDRAMINE HCL 25 MG PO CAPS: 25.0000 mg | ORAL_CAPSULE | Freq: Four times a day (QID) | ORAL | Status: DC | PRN

## 2017-06-24 NOTE — Progress Notes (Signed)
Patient ID: Alice Sweeney, female   DOB: 04/02/1986, 31 y.o.   MRN: [id number]  Patient seen and examined. Nursing unable to feel her cervix, but there is no bleed. Unable to palpate cervix on exam, but checked blood loss. Patient not losing much blood. Will continue to monitor. Feel as if her cervix is just very low. It was firm after delivery.   [place] J Shirley, DO

## 2017-06-24 NOTE — Lactation Note (Signed)
This note was copied from a baby's chart. Lactation Consultation Note  Patient Name: Alice Sweeney Today's Date: 06/24/2017 Reason for consult: Initial assessment   Spoke with [facility] interpreter, Bonnye FavaViria, who says FOB serving as interpreter for pt and has signed form. Went to room to visit with mom, Mom spoke in broken [place] and was able to let me know FOB will return in about 30 minutes.   Mom had infant latched to breast and said it was a little painful. Infant was not supported well. Assisted mom with aligning and supporting infant and relatched infant. Mom reports latch felt better.  Bf resources handout and Spanish LC Brochure left at bedside.   Maternal Data Formula Feeding for Exclusion: No  Feeding    LATCH Score/Interventions                      Lactation Tools Discussed/Used     Consult Status Consult Status: Follow-up Date: 06/24/17 Follow-up type: In-patient    Silas FloodSharon S Leodan Bolyard 06/24/2017, 4:52 PM

## 2017-06-24 NOTE — Lactation Note (Signed)
This note was copied from a baby's chart. Lactation Consultation Note  Patient Name: Alice Woodward KuMaria Couzens WUXLK'GToday's Date: 06/24/2017 Reason for consult: Follow-up assessment Baby at 16 hr of life. Upon entry mom was sleeping.   Maternal Data Formula Feeding for Exclusion: No  Feeding Feeding Type: Breast Fed Length of feed: 5 min  LATCH Score/Interventions Latch: Repeated attempts needed to sustain latch, nipple held in mouth throughout feeding, stimulation needed to elicit sucking reflex. Intervention(s): Adjust position;Assist with latch;Breast compression  Audible Swallowing: Spontaneous and intermittent Intervention(s): Skin to skin;Hand expression  Type of Nipple: Everted at rest and after stimulation  Comfort (Breast/Nipple): Soft / non-tender     Hold (Positioning): Assistance needed to correctly position infant at breast and maintain latch. Intervention(s): Breastfeeding basics reviewed;Support Pillows;Position options;Skin to skin  LATCH Score: 8  Lactation Tools Discussed/Used     Consult Status Consult Status: Follow-up Date: 06/24/17 Follow-up type: In-patient    Alice Sweeney 06/24/2017, 7:08 PM

## 2017-06-24 NOTE — Plan of Care (Signed)
Problem: Nutritional: Goal: Mothers verbalization of comfort with breastfeeding process will improve Outcome: Progressing Pt expressed concern that baby was not feeding for long periods of time, but falling asleep after being at the breast for 5 to 10 minutes.  Newborn feeding patterns and waking techniques discussed using the in house interpreter since FOB had gone home.  Ways to tell if baby is satisfied and cluster feeding discussed.  Pt asked about a nipple shield because she thought it would help the baby to stay awake longer at the breast.  Discussed the risks of a nipple shield and pt declined to use at this time.  Baby latches easily to the breast with frequent swallows heard.  Nipple everted and Pt denies any nipple pain.  Encouraged Pt to call for assistance with next latch.  Pt verbalized understanding.

## 2017-06-24 NOTE — Progress Notes (Signed)
UR chart review completed.  

## 2017-06-25 NOTE — Progress Notes (Signed)
POSTPARTUM PROGRESS NOTE  Post Partum Day 1 Subjective:  Alice Sweeney is a 31 y.o. G1P1001 6649w5d s/p SVD.  No acute events overnight.  Pt denies problems with ambulating, voiding or po intake.  She denies nausea or vomiting.  Pain is moderately controlled.  She has had flatus. She has not had bowel movement.  Lochia Small.   Objective: Blood pressure 120/67, pulse 64, temperature 98.4 F (36.9 C), temperature source Oral, resp. rate 18, height 5' (1.524 m), weight 163 lb (73.9 kg), last menstrual period 09/19/2016, SpO2 98 %, unknown if currently breastfeeding.  Physical Exam:  General: alert, cooperative and no distress Lochia:normal flow Chest: no respiratory distress Heart:regular rate, distal pulses intact Abdomen: soft, nontender,  Uterine Fundus: firm, appropriately tender DVT Evaluation: No calf swelling or tenderness Extremities: trace edema   Recent Labs  06/23/17 0950  HGB 11.5*  HCT 34.5*    Assessment/Plan:  ASSESSMENT: Alice Sweeney is a 31 y.o. G1P1001 9049w5d s/p SVD  Plan for discharge tomorrow   LOS: 2 days   Les Pouicholas SchenkMD 06/25/2017, 1:51 PM

## 2017-06-25 NOTE — Lactation Note (Signed)
This note was copied from a baby's chart. Lactation Consultation Note  Patient Name: Alice Sweeney [id number]'XToday's Date: 06/25/2017 Reason for consult: Follow-up assessment  Follow up visit at 43 hours of age. FOB interprets spanish for mom.  Mom attempting latch on right breast in modified cradle hold.  Baby on and off a little fussy and sleepy.  Mom reports recent feeding of 15 minutes on each breast and then trying a little more now.  Mom has history of augmentation, she thinks behind the muscle.  Mom reports small B cup prior to augment and full B cup after augmentation.  Mom denies breast changes in size during feeding, mom does report nipple/areola darkening.  Mom has round full breast with short semi flat nipples.  Mom has easily expressed colostrum.   FOB reports recent large stool after last stool was 24 hours ago.  Baby appears jaundice in color.  LC encouraged mom to post pump with DEBP to increase supply and offer supplement of EBM as available. LC discussed spoon feeding and will need instructions if EBM is collects.   LC worked with mom on cradle hold and wide open mouth for deep latching.  Mom needs to "sandwich" her breast to allow baby a deep latch.  FOB at bedside supportive.     Maternal Data Has patient been taught Hand Expression?: Yes  Feeding Feeding Type: Breast Fed Length of feed:  (few minutes)  LATCH Score/Interventions Latch: Repeated attempts needed to sustain latch, nipple held in mouth throughout feeding, stimulation needed to elicit sucking reflex. Intervention(s): Adjust position;Assist with latch;Breast massage;Breast compression  Audible Swallowing: A few with stimulation Intervention(s): Skin to skin;Hand expression;Alternate breast massage  Type of Nipple: Flat  Comfort (Breast/Nipple): Soft / non-tender     Hold (Positioning): Assistance needed to correctly position infant at breast and maintain latch. Intervention(s): Breastfeeding basics  reviewed;Support Pillows;Position options;Skin to skin  LATCH Score: 6  Lactation Tools Discussed/Used     Consult Status Consult Status: Follow-up Date: 06/26/17 Follow-up type: In-patient    Jannifer Sweeney, Alice Calloway Lynn 06/25/2017, 10:04 PM

## 2017-06-26 MED ORDER — NORETHINDRONE 0.35 MG PO TABS: 1.0000 | ORAL_TABLET | Freq: Every day | ORAL | 11 refills | Status: AC

## 2017-06-26 MED ORDER — IBUPROFEN 600 MG PO TABS: 600.0000 mg | ORAL_TABLET | Freq: Four times a day (QID) | ORAL | 0 refills | Status: AC

## 2017-06-26 NOTE — Discharge Summary (Signed)
       OB Discharge Summary  Patient Name: Alice Sweeney DOB: 02/18/1986 MRN: [id number]  Date of admission: 06/23/2017 Delivering MD: Howard PouchFENG, LAUREN   Date of discharge: 06/26/2017  Admitting diagnosis: 39WKS,LABOR Intrauterine pregnancy: 4232w5d     Secondary diagnosis:Active Problems:   Normal labor  Additional problems:none     Discharge diagnosis: Term Pregnancy Delivered                                                                     Post partum procedures:n/a  Augmentation: Pitocin  Complications: None  Hospital course:  Onset of Labor With Vaginal Delivery     31 y.o. yo G1P1001 at 3432w5d was admitted in Active Labor on 06/23/2017. Patient had an uncomplicated labor course as follows:  Membrane Rupture Time/Date: 7:04 PM ,06/23/2017   Intrapartum Procedures: Episiotomy: None [1]                                         Lacerations:  Labial [10]  Patient had a delivery of a Viable infant. 06/24/2017  Information for the patient's newborn:  Levon HedgerMartinez, Girl Marit [[id number]][[id number]]  Delivery Method: Vag-Spont    Pateint had an uncomplicated postpartum course.  She is ambulating, tolerating a regular diet, passing flatus, and urinating well. Patient is discharged home in stable condition on 06/26/17.   Physical exam  Vitals:   06/24/17 1800 06/25/17 0604 06/25/17 1945 06/26/17 0542  BP: 111/76 120/67 113/71 116/67  Pulse: 60 64 (!) 58 85  Resp: 16 18 18 18   Temp: 97.8 F (36.6 C) 98.4 F (36.9 C) 98.4 F (36.9 C) 98.4 F (36.9 C)  TempSrc: Oral Oral Oral Oral  SpO2:    100%  Weight:      Height:       General: alert, cooperative and no distress Lochia: appropriate Uterine Fundus: firm Incision: N/A DVT Evaluation: No evidence of DVT seen on physical exam. Labs: Lab Results  Component Value Date   WBC 23.1 (H) 06/23/2017   HGB 11.5 (L) 06/23/2017   HCT 34.5 (L) 06/23/2017   MCV 89.4 06/23/2017   PLT 315 06/23/2017   No flowsheet data found.  Discharge instruction:  per After Visit Summary and "Baby and Me Booklet".  After Visit Meds:    Diet: reg diet  Activity: Advance as tolerated. Pelvic rest for 6 weeks.   Outpatient follow up:6 weeks Follow up Appt:No future appointments. Follow up visit: No Follow-up on file.  Postpartum contraception: Progesterone only pills  Newborn Data: Live born female  Birth Weight: 7 lb 5.1 oz (3320 g) APGAR: 8, 9  Baby Feeding: Breast Disposition:home with mother   06/26/2017 Wyvonnia DuskyMarie Marnee Sherrard, CNM

## 2018-01-12 ENCOUNTER — Emergency Department (HOSPITAL_COMMUNITY): Payer: No Typology Code available for payment source

## 2018-01-12 ENCOUNTER — Encounter (HOSPITAL_COMMUNITY): Payer: Self-pay | Admitting: *Deleted

## 2018-01-12 ENCOUNTER — Emergency Department (HOSPITAL_COMMUNITY)
Admission: EM | Admit: 2018-01-12 | Discharge: 2018-01-12 | Disposition: A | Discharge status: Home / Self Care | Payer: No Typology Code available for payment source | Attending: Emergency Medicine | Admitting: Emergency Medicine

## 2018-01-12 ENCOUNTER — Other Ambulatory Visit: Payer: Self-pay

## 2018-01-12 DIAGNOSIS — M545 Low back pain: Secondary | ICD-10-CM | POA: Insufficient documentation

## 2018-01-12 DIAGNOSIS — N2889 Other specified disorders of kidney and ureter: Secondary | ICD-10-CM | POA: Diagnosis not present

## 2018-01-12 DIAGNOSIS — R112 Nausea with vomiting, unspecified: Secondary | ICD-10-CM | POA: Insufficient documentation

## 2018-01-12 DIAGNOSIS — N200 Calculus of kidney: Secondary | ICD-10-CM | POA: Insufficient documentation

## 2018-01-12 DIAGNOSIS — R197 Diarrhea, unspecified: Secondary | ICD-10-CM | POA: Insufficient documentation

## 2018-01-12 DIAGNOSIS — R509 Fever, unspecified: Secondary | ICD-10-CM | POA: Diagnosis not present

## 2018-01-12 DIAGNOSIS — Z79899 Other long term (current) drug therapy: Secondary | ICD-10-CM | POA: Diagnosis not present

## 2018-01-12 DIAGNOSIS — R1031 Right lower quadrant pain: Secondary | ICD-10-CM | POA: Diagnosis present

## 2018-01-12 LAB — CBC
HEMATOCRIT: 40.6 % (ref 36.0–46.0)
HEMOGLOBIN: 13.1 g/dL (ref 12.0–15.0)
MCH: 28.7 pg (ref 26.0–34.0)
MCHC: 32.3 g/dL (ref 30.0–36.0)
MCV: 89 fL (ref 78.0–100.0)
Platelets: 365 10*3/uL (ref 150–400)
RBC: 4.56 MIL/uL (ref 3.87–5.11)
RDW: 13.2 % (ref 11.5–15.5)
WBC: 8.1 10*3/uL (ref 4.0–10.5)

## 2018-01-12 LAB — COMPREHENSIVE METABOLIC PANEL
ALBUMIN: 4.2 g/dL (ref 3.5–5.0)
ALT: 48 U/L (ref 14–54)
ANION GAP: 11 (ref 5–15)
AST: 37 U/L (ref 15–41)
Alkaline Phosphatase: 179 U/L — ABNORMAL HIGH (ref 38–126)
BUN: 14 mg/dL (ref 6–20)
CO2: 19 mmol/L — AB (ref 22–32)
Calcium: 9 mg/dL (ref 8.9–10.3)
Chloride: 107 mmol/L (ref 101–111)
Creatinine, Ser: 0.7 mg/dL (ref 0.44–1.00)
GFR calc Af Amer: >60 mL/min (ref >60)
GFR calc non Af Amer: >60 mL/min (ref >60)
GLUCOSE: 97 mg/dL (ref 65–99)
POTASSIUM: 3.8 mmol/L (ref 3.5–5.1)
SODIUM: 137 mmol/L (ref 135–145)
Total Bilirubin: 0.7 mg/dL (ref 0.3–1.2)
Total Protein: 7.4 g/dL (ref 6.5–8.1)

## 2018-01-12 LAB — URINALYSIS, ROUTINE W REFLEX MICROSCOPIC
BILIRUBIN URINE: NEGATIVE
Glucose, UA: NEGATIVE mg/dL
Ketones, ur: 15 mg/dL — AB
Leukocytes, UA: NEGATIVE
Nitrite: NEGATIVE
Protein, ur: 30 mg/dL — AB
pH: 5.5 (ref 5.0–8.0)

## 2018-01-12 LAB — I-STAT BETA HCG BLOOD, ED (MC, WL, AP ONLY): I-stat hCG, quantitative: <5 m[IU]/mL (ref <5)

## 2018-01-12 LAB — LIPASE, BLOOD: Lipase: 26 U/L (ref 11–51)

## 2018-01-12 LAB — URINALYSIS, MICROSCOPIC (REFLEX)

## 2018-01-12 MED ORDER — ONDANSETRON HCL 4 MG/2ML IJ SOLN
4.0000 mg | Freq: Once | INTRAMUSCULAR | Status: AC
  Administered 2018-01-12: 4 mg via INTRAVENOUS
  Filled 2018-01-12: qty 2

## 2018-01-12 MED ORDER — IOPAMIDOL (ISOVUE-300) INJECTION 61%
100.0000 mL | Freq: Once | INTRAVENOUS | Status: AC | PRN
  Administered 2018-01-12: 100 mL via INTRAVENOUS

## 2018-01-12 MED ORDER — SODIUM CHLORIDE 0.9 % IV BOLUS (SEPSIS)
1000.0000 mL | Freq: Once | INTRAVENOUS | Status: AC
  Administered 2018-01-12: 1000 mL via INTRAVENOUS

## 2018-01-12 MED ORDER — FEXOFENADINE HCL 60 MG PO TABS: 60.0000 mg | ORAL_TABLET | Freq: Two times a day (BID) | ORAL | 0 refills | Status: AC | PRN

## 2018-01-12 MED ORDER — HYDROCODONE-ACETAMINOPHEN 5-325 MG PO TABS: ORAL_TABLET | ORAL | 0 refills | Status: AC

## 2018-01-12 NOTE — ED Notes (Signed)
Pelvic cart set up at bedside  

## 2018-01-12 NOTE — ED Notes (Signed)
Translator at bedside. 

## 2018-01-12 NOTE — Discharge Instructions (Signed)
°  Si decide tomar el Vicodin, tendr que bombearlo, descargarlo y alimentarlo con la frmula del beb durante las 24 horas posteriores a la toma del medicamento narctico para [employer] dolor. No dude en regresar a la sala de emergencias por cualquier sntoma nuevo, que empeore o relacionado con los sntomas.

## 2018-01-12 NOTE — ED Notes (Signed)
Pt returned from Ultrasound.

## 2018-01-12 NOTE — ED Provider Notes (Signed)
[facility] DEPARTMENT Provider Note   CSN: [id number] Arrival date & time: 01/12/18  1206     History   Chief Complaint Chief Complaint  Patient presents with  . Abdominal Pain  . Back Pain    HPI   Blood pressure (!) 146/107, pulse 83, temperature 99 F (37.2 C), temperature source Oral, resp. rate 18, last menstrual period 01/10/2018, SpO2 100 %, unknown if currently breastfeeding.  Alice Sweeney is a 32 y.o. female complaining of right low back and right lower quadrant abdominal pain onset yesterday, rated at 8 out of 10 followed by nausea, vomiting and diarrhea with tactile fever.  Patient is menstruating, no history of kidney stones.  She states that she had some thick white vaginal discharge before her menses started.  No prior history of any abdominal surgeries.  She denies dysuria.  She is breast-feeding at this time.  History reviewed. No pertinent past medical history.  Patient Active Problem List   Diagnosis Date Noted  . Normal labor 06/23/2017    Past Surgical History:  Procedure Laterality Date  . BREAST ENHANCEMENT SURGERY    . BREAST SURGERY      OB History    Gravida Para Term Preterm AB Living   _0 SAB TAB Ectopic Multiple Live Births         0 1       Home Medications    Prior to Admission medications   Medication Sig Start Date End Date Taking? Authorizing Provider  ibuprofen (ADVIL,MOTRIN) 200 MG tablet Take 400-600 mg by mouth every 6 (six) hours as needed for mild pain.   Yes [provider]  norethindrone (CAMILA) 0.35 MG tablet Take 1 tablet (0.35 mg total) by mouth daily. 06/26/17  Yes Keitha Butte, CNM  cyclobenzaprine (FLEXERIL) 10 MG tablet Take 1 tablet (10 mg total) by mouth at bedtime and may repeat dose one time if needed. Patient not taking: Reported on 06/23/2017 01/18/17   Recardo Evangelist, PA-C  fexofenadine (ALLEGRA) 60 MG tablet Take 1 tablet (60 mg total) by mouth 2 (two)  times daily as needed (Nausea and vomitting). 01/12/18   Kirstina Leinweber, Elmyra Ricks, PA-C  HYDROcodone-acetaminophen (NORCO/VICODIN) 5-325 MG tablet Take 1-2 tablets by mouth every 6 hours as needed for pain and/or cough. 01/12/18   Shelbe Haglund, Elmyra Ricks, PA-C  ibuprofen (ADVIL,MOTRIN) 600 MG tablet Take 1 tablet (600 mg total) by mouth every 6 (six) hours. Patient not taking: Reported on 01/12/2018 06/26/17   Keitha Butte, CNM    Family History History reviewed. No pertinent family history.  Social History Social History   Tobacco Use  . Smoking status: Never Smoker  . Smokeless tobacco: Never Used  Substance Use Topics  . Alcohol use: No  . Drug use: No     Allergies   Patient has no known allergies.   Review of Systems Review of Systems  A complete review of systems was obtained and all systems are negative except as noted in the HPI and PMH.   Physical Exam Updated Vital Signs BP (!) 109/59   Pulse (!) 52   Temp 99 F (37.2 C) (Oral)   Resp 18   LMP 01/10/2018   SpO2 98%   Physical Exam  Constitutional: She is oriented to person, place, and time. She appears well-developed and well-nourished. No distress.  HENT:  Head: Normocephalic and atraumatic.  Mouth/Throat: Oropharynx is clear and moist.  Eyes:  Conjunctivae and EOM are normal. Pupils are equal, round, and reactive to light.  Neck: Normal range of motion.  Cardiovascular: Normal rate, regular rhythm and intact distal pulses.  Pulmonary/Chest: Effort normal and breath sounds normal.  Abdominal: Soft. There is no tenderness.  Tender in the right lower quadrant, Rovsing negative, so as an obturator positive.  Genitourinary:  Genitourinary Comments: No CVA tenderness to percussion bilaterally  Musculoskeletal: Normal range of motion.  Neurological: She is alert and oriented to person, place, and time.  Skin: She is not diaphoretic.  Psychiatric: She has a normal mood and affect.  Nursing note and vitals  reviewed.    ED Treatments / Results  Labs (all labs ordered are listed, but only abnormal results are displayed) Labs Reviewed  COMPREHENSIVE METABOLIC PANEL - Abnormal; Notable for the following components:      Result Value   CO2 19 (*)    Alkaline Phosphatase 179 (*)    All other components within normal limits  URINALYSIS, ROUTINE W REFLEX MICROSCOPIC - Abnormal; Notable for the following components:   APPearance CLOUDY (*)    Specific Gravity, Urine >1.030 (*)    Hgb urine dipstick LARGE (*)    Ketones, ur 15 (*)    Protein, ur 30 (*)    All other components within normal limits  URINALYSIS, MICROSCOPIC (REFLEX) - Abnormal; Notable for the following components:   Bacteria, UA FEW (*)    Squamous Epithelial / LPF 6-30 (*)    All other components within normal limits  LIPASE, BLOOD  CBC  RPR  HIV ANTIBODY (ROUTINE TESTING)  I-STAT BETA HCG BLOOD, ED ([facility], [facility], [facility] ONLY)  GC/CHLAMYDIA PROBE AMP ([place]) NOT AT [facility]    EKG  EKG Interpretation None       Radiology US Transvaginal Non-ob  Result Date: 01/12/2018 CLINICAL DATA:  32 year old female with right lower quadrant and low back pain since yesterday with vomiting and diarrhea. LMP 01/11/2018. EXAM: TRANSABDOMINAL AND TRANSVAGINAL ULTRASOUND OF PELVIS DOPPLER ULTRASOUND OF OVARIES TECHNIQUE: Both transabdominal and transvaginal ultrasound examinations of the pelvis were performed. Transabdominal technique was performed for global imaging of the pelvis including uterus, ovaries, adnexal regions, and pelvic cul-de-sac. It was necessary to proceed with endovaginal exam following the transabdominal exam to visualize the endometrium and adnexa. Color and duplex Doppler ultrasound was utilized to evaluate blood flow to the ovaries. COMPARISON:  10/25/2016 obstetric scan. FINDINGS: Uterus Measurements: 6.0 x 3.0 x 3.6 cm. Anteverted uterus is normal in size and configuration, with no uterine fibroids or other myometrial  abnormalities. Endometrium Thickness: 5 mm. No endometrial cavity fluid or focal endometrial mass. Right ovary Measurements: 2.9 x 1.0 x 3.2 cm. Normal appearance/no adnexal mass. Left ovary Measurements: 2.7 x 1.3 x 2.0 cm. Normal appearance/no adnexal mass. Pulsed Doppler evaluation of both ovaries demonstrates normal low-resistance arterial and venous waveforms. Other findings Incidentally noted is a shadowing 4 mm stone at the right ureterovesical junction with associated distal right ureterectasis. No abnormal free fluid in the pelvis. IMPRESSION: 1. Incidentally detected 4 mm stone at the right ureterovesical junction with associated distal right ureterectasis, compatible with an obstructing right UVJ stone. Renal ultrasound may be obtained to assess for hydronephrosis as clinically warranted. 2. Otherwise normal pelvic sonogram. Normal ovaries, with no evidence of adnexal torsion. Electronically Signed   By: Ilona Sorrel M.D.   On: 01/12/2018 15:42   US Pelvis Complete  Result Date: 01/12/2018 CLINICAL DATA:  32 year old female with right lower quadrant and  low back pain since yesterday with vomiting and diarrhea. LMP 01/11/2018. EXAM: TRANSABDOMINAL AND TRANSVAGINAL ULTRASOUND OF PELVIS DOPPLER ULTRASOUND OF OVARIES TECHNIQUE: Both transabdominal and transvaginal ultrasound examinations of the pelvis were performed. Transabdominal technique was performed for global imaging of the pelvis including uterus, ovaries, adnexal regions, and pelvic cul-de-sac. It was necessary to proceed with endovaginal exam following the transabdominal exam to visualize the endometrium and adnexa. Color and duplex Doppler ultrasound was utilized to evaluate blood flow to the ovaries. COMPARISON:  10/25/2016 obstetric scan. FINDINGS: Uterus Measurements: 6.0 x 3.0 x 3.6 cm. Anteverted uterus is normal in size and configuration, with no uterine fibroids or other myometrial abnormalities. Endometrium Thickness: 5 mm. No  endometrial cavity fluid or focal endometrial mass. Right ovary Measurements: 2.9 x 1.0 x 3.2 cm. Normal appearance/no adnexal mass. Left ovary Measurements: 2.7 x 1.3 x 2.0 cm. Normal appearance/no adnexal mass. Pulsed Doppler evaluation of both ovaries demonstrates normal low-resistance arterial and venous waveforms. Other findings Incidentally noted is a shadowing 4 mm stone at the right ureterovesical junction with associated distal right ureterectasis. No abnormal free fluid in the pelvis. IMPRESSION: 1. Incidentally detected 4 mm stone at the right ureterovesical junction with associated distal right ureterectasis, compatible with an obstructing right UVJ stone. Renal ultrasound may be obtained to assess for hydronephrosis as clinically warranted. 2. Otherwise normal pelvic sonogram. Normal ovaries, with no evidence of adnexal torsion. Electronically Signed   By: Ilona Sorrel M.D.   On: 01/12/2018 15:42   Ct Abdomen Pelvis W Contrast  Result Date: 01/12/2018 CLINICAL DATA:  Right lower quadrant pain, right-sided back pain for 1 year EXAM: CT ABDOMEN AND PELVIS WITH CONTRAST TECHNIQUE: Multidetector CT imaging of the abdomen and pelvis was performed using the standard protocol following bolus administration of intravenous contrast. CONTRAST:  131m ISOVUE-300 IOPAMIDOL (ISOVUE-300) INJECTION 61% COMPARISON:  None. FINDINGS: Lower chest: No acute abnormality. Hepatobiliary: No focal liver abnormality is seen. No gallstones, gallbladder wall thickening, or biliary dilatation. Pancreas: Unremarkable. No pancreatic ductal dilatation or surrounding inflammatory changes. Spleen: Normal in size without focal abnormality. Adrenals/Urinary Tract: Normal adrenal glands. Normal left kidney. No renal mass. Punctate nonobstructing right renal calculus. Mild-moderate right hydroureteronephrosis with urothelial enhancement and perinephric stranding. Right ureterocele with a right UVJ calculus measuring 4 mm. Bladder is  otherwise unremarkable. Stomach/Bowel: Stomach is within normal limits. Appendix appears normal. No evidence of bowel wall thickening, distention, or inflammatory changes. Vascular/Lymphatic: No significant vascular findings are present. No enlarged abdominal or pelvic lymph nodes. Reproductive: Uterus and bilateral adnexa are unremarkable. Other: No abdominal wall hernia or abnormality. No abdominopelvic ascites. Musculoskeletal: No acute osseous abnormality. No aggressive osseous lesion. Bilateral L5 pars interarticularis defects. IMPRESSION: 1. Right ureterocele with a 4 mm right UVJ calculus resulting in mild-moderate right hydroureteronephrosis. Electronically Signed   By: HKathreen Devoid  On: 01/12/2018 15:56   [place]Art/ven Flow Abd Pelv Doppler  Result Date: 01/12/2018 CLINICAL DATA:  32year old female with right lower quadrant and low back pain since yesterday with vomiting and diarrhea. LMP 01/11/2018. EXAM: TRANSABDOMINAL AND TRANSVAGINAL ULTRASOUND OF PELVIS DOPPLER ULTRASOUND OF OVARIES TECHNIQUE: Both transabdominal and transvaginal ultrasound examinations of the pelvis were performed. Transabdominal technique was performed for global imaging of the pelvis including uterus, ovaries, adnexal regions, and pelvic cul-de-sac. It was necessary to proceed with endovaginal exam following the transabdominal exam to visualize the endometrium and adnexa. Color and duplex Doppler ultrasound was utilized to evaluate blood flow to the ovaries. COMPARISON:  10/25/2016 obstetric  scan. FINDINGS: Uterus Measurements: 6.0 x 3.0 x 3.6 cm. Anteverted uterus is normal in size and configuration, with no uterine fibroids or other myometrial abnormalities. Endometrium Thickness: 5 mm. No endometrial cavity fluid or focal endometrial mass. Right ovary Measurements: 2.9 x 1.0 x 3.2 cm. Normal appearance/no adnexal mass. Left ovary Measurements: 2.7 x 1.3 x 2.0 cm. Normal appearance/no adnexal mass. Pulsed Doppler evaluation of  both ovaries demonstrates normal low-resistance arterial and venous waveforms. Other findings Incidentally noted is a shadowing 4 mm stone at the right ureterovesical junction with associated distal right ureterectasis. No abnormal free fluid in the pelvis. IMPRESSION: 1. Incidentally detected 4 mm stone at the right ureterovesical junction with associated distal right ureterectasis, compatible with an obstructing right UVJ stone. Renal ultrasound may be obtained to assess for hydronephrosis as clinically warranted. 2. Otherwise normal pelvic sonogram. Normal ovaries, with no evidence of adnexal torsion. Electronically Signed   By: Ilona Sorrel M.D.   On: 01/12/2018 15:42    Procedures Procedures (including critical care time)  Medications Ordered in ED Medications  sodium chloride 0.9 % bolus 1,000 mL (0 mLs Intravenous Stopped 01/12/18 1520)  ondansetron (ZOFRAN) injection 4 mg (4 mg Intravenous Given 01/12/18 1411)  iopamidol (ISOVUE-300) 61 % injection 100 mL (100 mLs Intravenous Contrast Given 01/12/18 1541)     Initial Impression / Assessment and Plan / ED Course  I have reviewed the triage vital signs and the nursing notes.  Pertinent labs & imaging results that were available during my care of the patient were reviewed by me and considered in my medical decision making (see chart for details).     Vitals:   01/12/18 1215 01/12/18 1400 01/12/18 1430 01/12/18 1500  BP: (!) 146/107 108/66 106/66 (!) 109/59  Pulse: 83 76 63 (!) 52  Resp: 18     Temp: 99 F (37.2 C)     TempSrc: Oral     SpO2: 100% 97% 97% 98%    Medications  sodium chloride 0.9 % bolus 1,000 mL (0 mLs Intravenous Stopped 01/12/18 1520)  ondansetron (ZOFRAN) injection 4 mg (4 mg Intravenous Given 01/12/18 1411)  iopamidol (ISOVUE-300) 61 % injection 100 mL (100 mLs Intravenous Contrast Given 01/12/18 1541)    Anvita Hirata is 32 y.o. female presenting with acute onset of severe right lower quadrant pain with  associated tactile fever, nausea vomiting and diarrhea.  Abdominal exam tender, no peritoneal.  Consider possible appendicitis versus torsion versus kidney stone, no history of prior kidney stones.  Her urinalysis does have hemoglobin however she is menstruating at this time.  Blood work with no significant abnormality, mildly elevated alk phos.  CT of the right ureterocele and a 4 mm right UVJ calculus with mild to moderate right hydronephrosis.  Urinalysis with no signs of infection.  Discussed results with patient and her family.  Advised her that she can take Vicodin for pain control at home but she will need to pump and dump, and we have gone over the ureterocele which passage of the stone slightly more complicated.  Referral to [facility] is given.  She is invited to return to the ED at any time for any new, concerning or persistent symptoms.  Evaluation does not show pathology that would require ongoing emergent intervention or inpatient treatment. Pt is hemodynamically stable and mentating appropriately. Discussed findings and plan with patient/guardian, who agrees with care plan. All questions answered. Return precautions discussed and outpatient follow up given.    Final Clinical Impressions(s) /  ED Diagnoses   Final diagnoses:  Kidney stone  Ureterocele    ED Discharge Orders        Ordered    HYDROcodone-acetaminophen (NORCO/VICODIN) 5-325 MG tablet     01/12/18 1636    fexofenadine (ALLEGRA) 60 MG tablet  2 times daily PRN     01/12/18 1636       Ixel Boehning, Charna Elizabeth 01/12/18 1644    Drenda Freeze, MD 01/13/18 1524

## 2018-01-12 NOTE — ED Triage Notes (Signed)
Used interpretor line. Pt reports onset fever, RLQ pain and lower back pain yesterday. Reports vomiting and diarrhea. Denies urinary problems.

## 2018-01-12 NOTE — ED Notes (Signed)
Patient transported to Ultrasound 

## 2018-01-13 LAB — RPR: RPR Ser Ql: NONREACTIVE

## 2018-01-13 LAB — HIV ANTIBODY (ROUTINE TESTING W REFLEX): HIV Screen 4th Generation wRfx: NONREACTIVE

## 2018-03-13 ENCOUNTER — Other Ambulatory Visit: Payer: Self-pay | Admitting: Obstetrics & Gynecology

## 2018-03-13 DIAGNOSIS — Z1231 Encounter for screening mammogram for malignant neoplasm of breast: Secondary | ICD-10-CM

## 2018-04-14 ENCOUNTER — Other Ambulatory Visit (HOSPITAL_COMMUNITY): Payer: Self-pay | Admitting: *Deleted

## 2018-04-14 DIAGNOSIS — N644 Mastodynia: Secondary | ICD-10-CM

## 2018-05-02 ENCOUNTER — Other Ambulatory Visit: Payer: No Typology Code available for payment source

## 2018-05-02 ENCOUNTER — Ambulatory Visit (HOSPITAL_COMMUNITY): Payer: No Typology Code available for payment source

## 2018-06-01 IMAGING — CT CT ABD-PELV W/ CM
2 of 4 series · 16 of 46 positions shown, 18 images · IV contrast (Omni 300)
Comparison: None.

CLINICAL DATA: Right lower quadrant pain, right-sided back pain for
1 year

EXAM:
CT ABDOMEN AND PELVIS WITH CONTRAST
TECHNIQUE: Multidetector CT imaging of the abdomen and pelvis was performed
using the standard protocol following bolus administration of
intravenous contrast.
CONTRAST:  100mL EWR065-YTT IOPAMIDOL (EWR065-YTT) INJECTION 61%

[Series 3: a/p w/ 5mm · axial · 0.75mm/px · z∈[+735,+1155]mm · 13 of 93 slices shown, 15 images]
[im 5/93  soft-tissue]
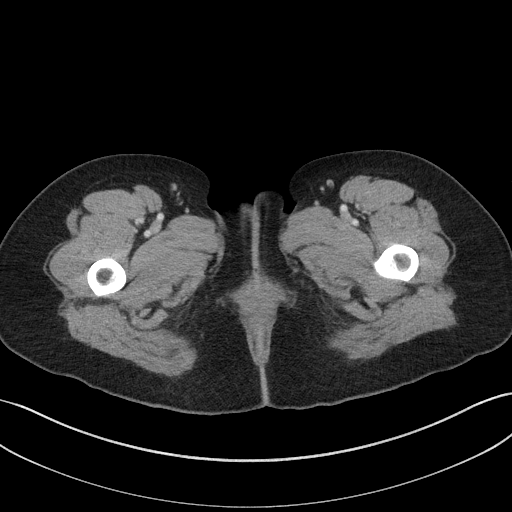
[im 5/93  bone]
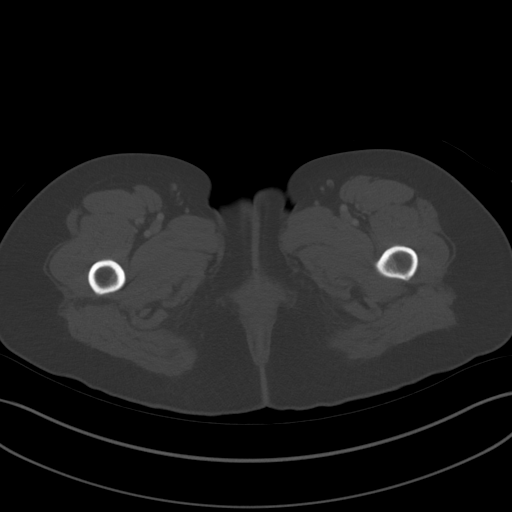
[im 13/93  soft-tissue]
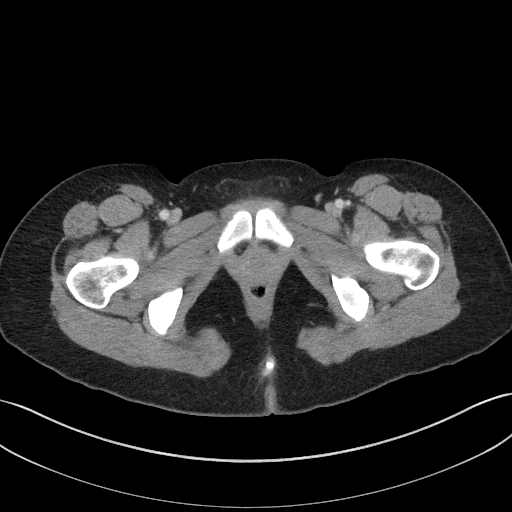
[im 21/93  soft-tissue]
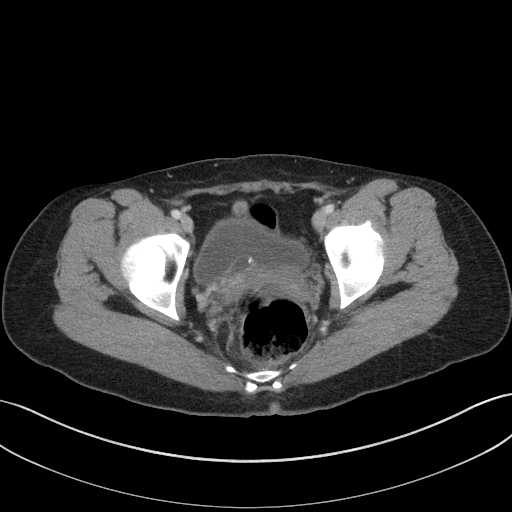
[im 25/93  soft-tissue]
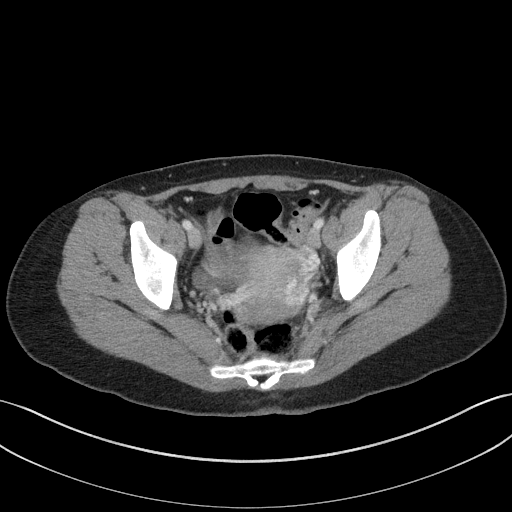
[im 33/93  soft-tissue]
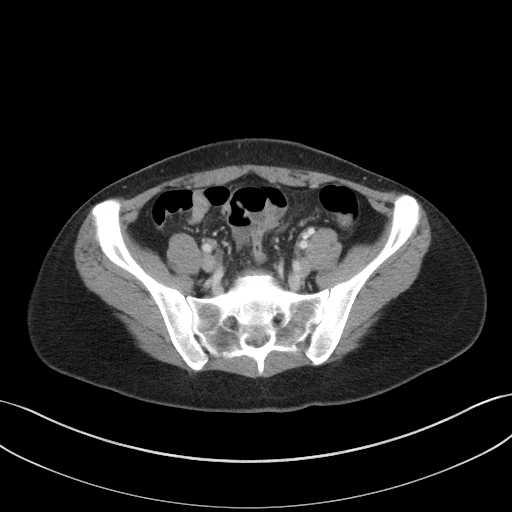
[im 41/93  soft-tissue]
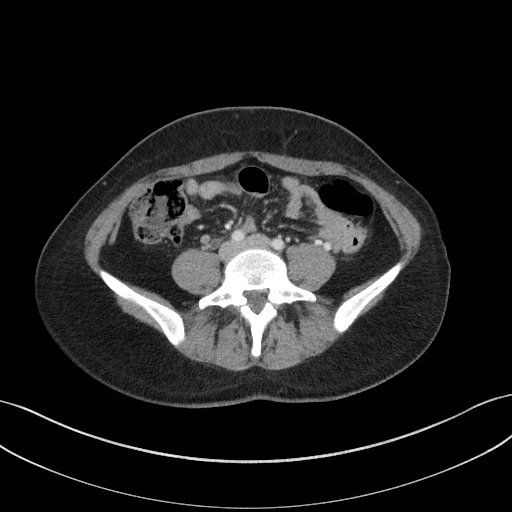
[im 49/93  soft-tissue]
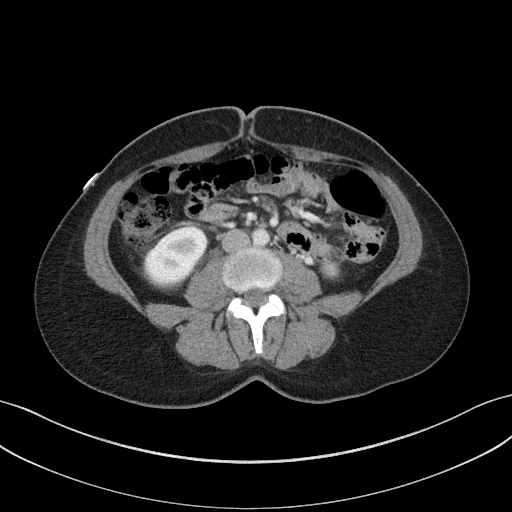
[im 53/93  soft-tissue]
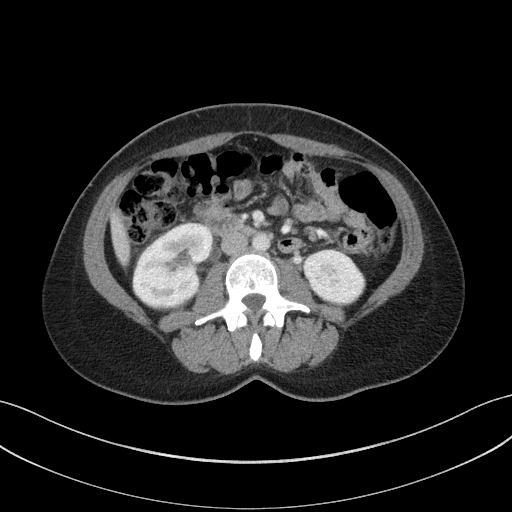
[im 61/93  soft-tissue]
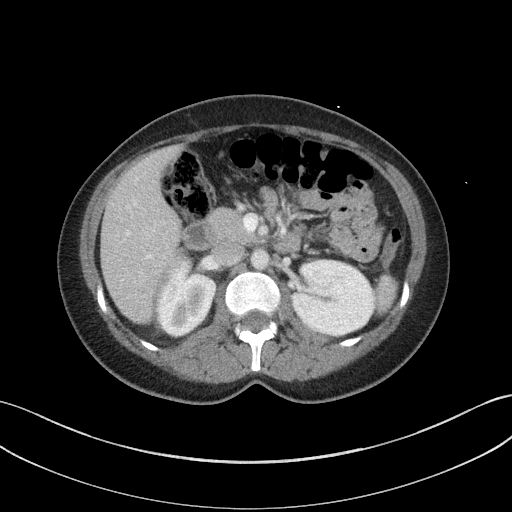
[im 61/93  bone]
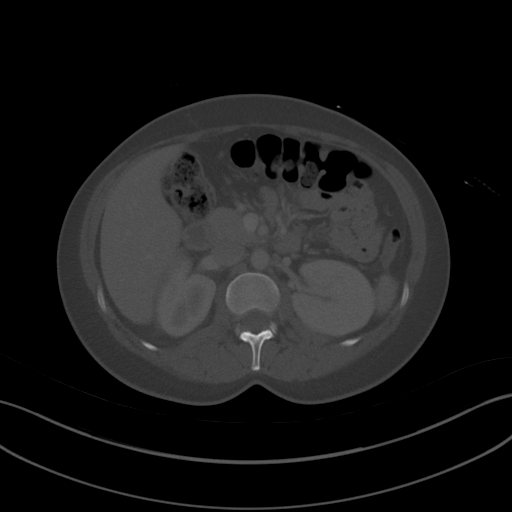
[im 69/93  soft-tissue]
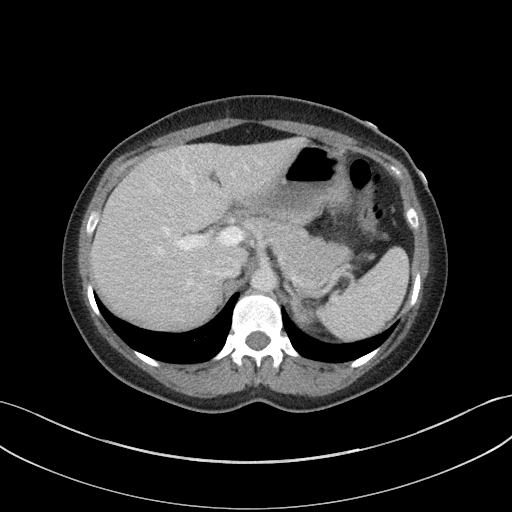
[im 73/93  soft-tissue]
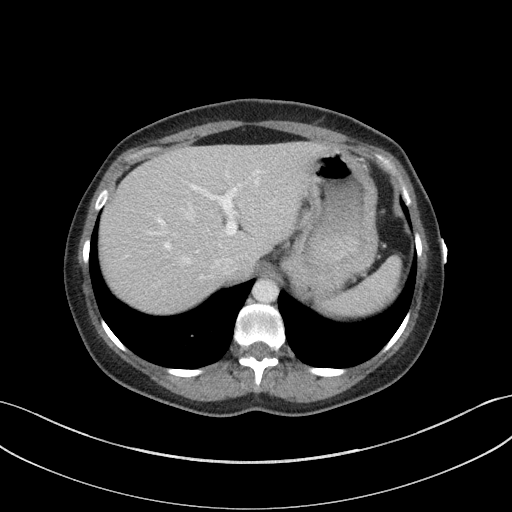
[im 81/93  soft-tissue]
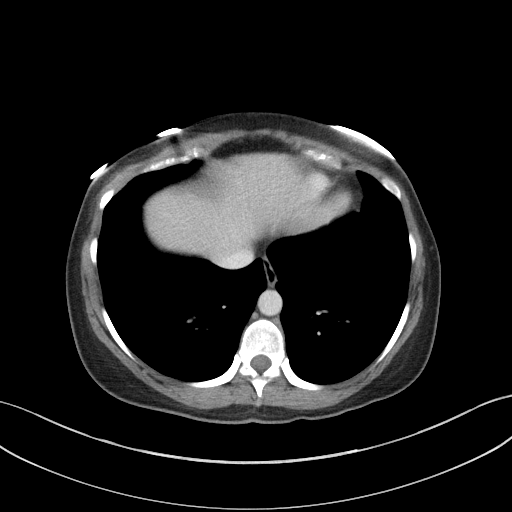
[im 89/93  soft-tissue]
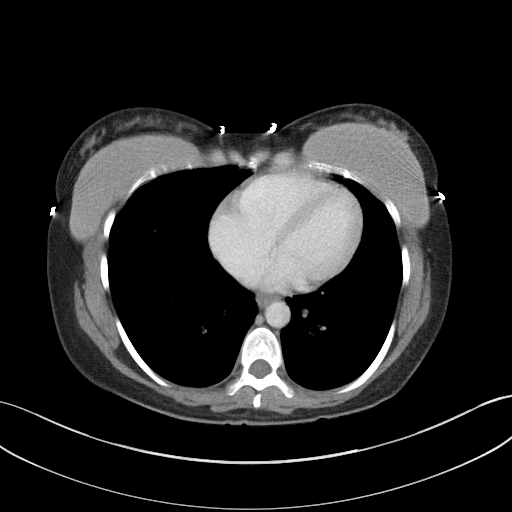

[Series 6: a/p w/ cor · coronal · 0.78mm/px · 3 of 128 slices shown]
[im 43/128  soft-tissue]
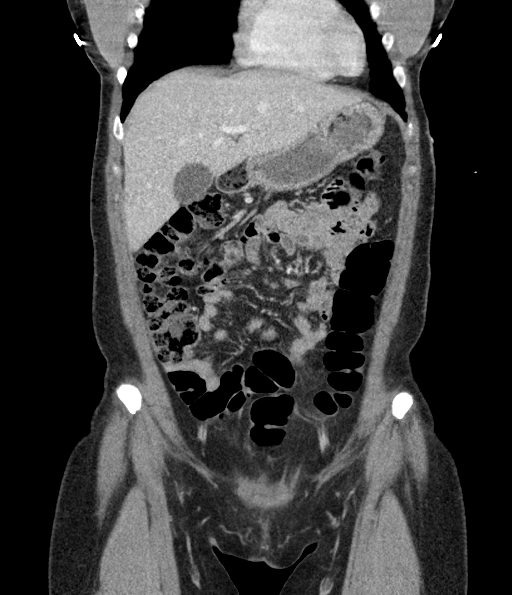
[im 57/128  soft-tissue]
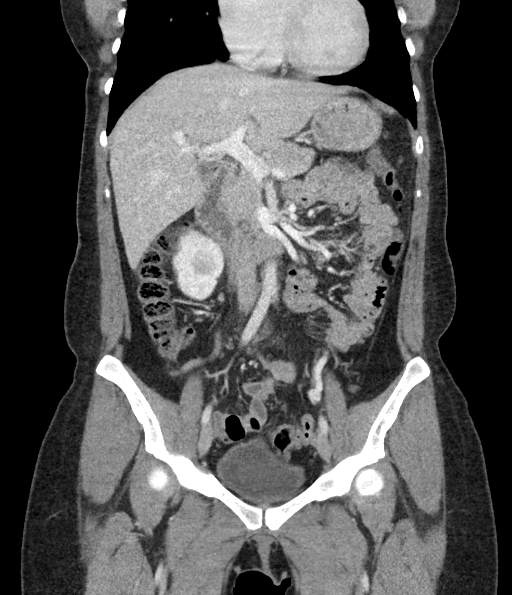
[im 71/128  soft-tissue]
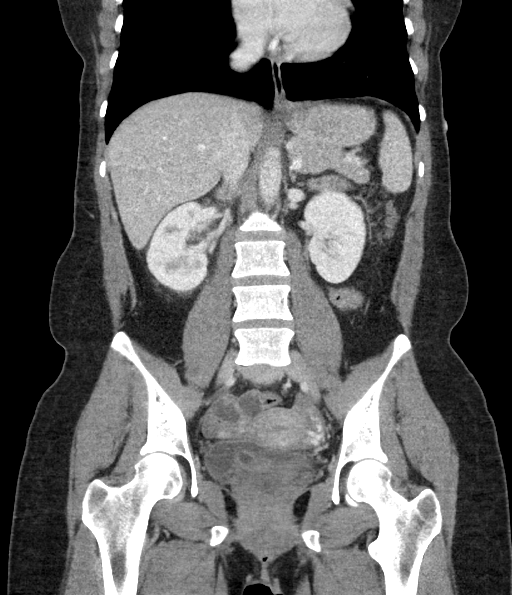

[16 of 46 positions shown; findings below may reference images not displayed]

FINDINGS: Lower chest: No acute abnormality.

Hepatobiliary: No focal liver abnormality is seen. No gallstones,
gallbladder wall thickening, or biliary dilatation.

Pancreas: Unremarkable. No pancreatic ductal dilatation or
surrounding inflammatory changes.

Spleen: Normal in size without focal abnormality.

Adrenals/Urinary Tract: Normal adrenal glands. Normal left kidney.
No renal mass. Punctate nonobstructing right renal calculus.
Mild-moderate right hydroureteronephrosis with urothelial
enhancement and perinephric stranding. Right ureterocele with a
right UVJ calculus measuring 4 mm. Bladder is otherwise
unremarkable.

Stomach/Bowel: Stomach is within normal limits. Appendix appears
normal. No evidence of bowel wall thickening, distention, or
inflammatory changes.

Vascular/Lymphatic: No significant vascular findings are present. No
enlarged abdominal or pelvic lymph nodes.

Reproductive: Uterus and bilateral adnexa are unremarkable.

Other: No abdominal wall hernia or abnormality. No abdominopelvic
ascites.

Musculoskeletal: No acute osseous abnormality. No aggressive osseous
lesion. Bilateral L5 pars interarticularis defects.
IMPRESSION: 1. Right ureterocele with a 4 mm right UVJ calculus resulting in
mild-moderate right hydroureteronephrosis.
